# Patient Record
Sex: Male | Born: 1980 | Race: White | Hispanic: No | Smoking: Current every day smoker
Health system: Southern US, Community
[De-identification: ages and names within clinical notes are randomized; demographics above are authoritative.]

## PROBLEM LIST (undated history)

## (undated) HISTORY — PX: FEMUR FRACTURE SURGERY: SHX633

---

## 2016-02-08 ENCOUNTER — Emergency Department (HOSPITAL_BASED_OUTPATIENT_CLINIC_OR_DEPARTMENT_OTHER): Payer: Self-pay

## 2016-02-08 ENCOUNTER — Encounter (HOSPITAL_BASED_OUTPATIENT_CLINIC_OR_DEPARTMENT_OTHER): Payer: Self-pay | Admitting: *Deleted

## 2016-02-08 ENCOUNTER — Emergency Department (HOSPITAL_BASED_OUTPATIENT_CLINIC_OR_DEPARTMENT_OTHER)
Admission: EM | Admit: 2016-02-08 | Discharge: 2016-02-08 | Disposition: A | Discharge status: Home / Self Care | Payer: Self-pay | Attending: Emergency Medicine | Admitting: Emergency Medicine

## 2016-02-08 DIAGNOSIS — N5082 Scrotal pain: Secondary | ICD-10-CM

## 2016-02-08 DIAGNOSIS — N50811 Right testicular pain: Secondary | ICD-10-CM | POA: Insufficient documentation

## 2016-02-08 DIAGNOSIS — N50819 Testicular pain, unspecified: Secondary | ICD-10-CM

## 2016-02-08 DIAGNOSIS — G8929 Other chronic pain: Secondary | ICD-10-CM | POA: Insufficient documentation

## 2016-02-08 DIAGNOSIS — R52 Pain, unspecified: Secondary | ICD-10-CM

## 2016-02-08 DIAGNOSIS — N50812 Left testicular pain: Secondary | ICD-10-CM | POA: Insufficient documentation

## 2016-02-08 DIAGNOSIS — M79651 Pain in right thigh: Secondary | ICD-10-CM | POA: Insufficient documentation

## 2016-02-08 DIAGNOSIS — N419 Inflammatory disease of prostate, unspecified: Secondary | ICD-10-CM | POA: Insufficient documentation

## 2016-02-08 DIAGNOSIS — M25551 Pain in right hip: Secondary | ICD-10-CM | POA: Insufficient documentation

## 2016-02-08 LAB — URINE MICROSCOPIC-ADD ON

## 2016-02-08 LAB — URINALYSIS, ROUTINE W REFLEX MICROSCOPIC
GLUCOSE, UA: NEGATIVE mg/dL
HGB URINE DIPSTICK: NEGATIVE
Ketones, ur: 40 mg/dL — AB
Nitrite: NEGATIVE
Protein, ur: 30 mg/dL — AB
SPECIFIC GRAVITY, URINE: 1.023 (ref 1.005–1.030)
pH: 5.5 (ref 5.0–8.0)

## 2016-02-08 LAB — OCCULT BLOOD X 1 CARD TO LAB, STOOL: FECAL OCCULT BLD: NEGATIVE

## 2016-02-08 MED ORDER — AZITHROMYCIN 1 G PO PACK
1.0000 g | PACK | Freq: Once | ORAL | Status: AC
  Administered 2016-02-08: 1 g via ORAL
  Filled 2016-02-08: qty 1

## 2016-02-08 MED ORDER — IBUPROFEN 800 MG PO TABS: 800.0000 mg | ORAL_TABLET | Freq: Three times a day (TID) | ORAL | Status: AC

## 2016-02-08 MED ORDER — CIPROFLOXACIN HCL 500 MG PO TABS
500.0000 mg | ORAL_TABLET | Freq: Two times a day (BID) | ORAL | Status: AC
Start: 2016-02-08 — End: ?

## 2016-02-08 MED ORDER — IBUPROFEN 800 MG PO TABS
800.0000 mg | ORAL_TABLET | Freq: Once | ORAL | Status: AC
  Administered 2016-02-08: 800 mg via ORAL
  Filled 2016-02-08: qty 1

## 2016-02-08 MED FILL — CIPROFLOXACIN HCL 500 MG TA: 500 | 9 days supply | Qty: 18 | Fill #0

## 2016-02-08 NOTE — ED Provider Notes (Signed)
CSN: [id number]     Arrival date & time 02/08/16  1249 History   First MD Initiated Contact with Patient 02/08/16 1504     Chief Complaint  Patient presents with  . Leg Pain     (Consider location/radiation/quality/duration/timing/severity/associated sxs/prior Treatment) HPI The patient reports problems with scrotal pain, ongoing for approximately a year.Marland Kitchen He reports his testicles hurt. The pain waxes and waning in severity. Sometimes is both testicles. Sometimes the left more than the right. He gets an aching pain like he's been "kicked". He denies urinary difficulty. No pain burning or urgency. No testicular lesions or drainage. His girlfriend also notes little nodules in the scrotum. She also reports that several months ago he was having bleeding with bowel movements. That reportedly lasted for several weeks and then resolved. Patient denies any weight loss. She reports she's always been thin. He has not had fevers or chills. He does not get nausea or vomiting. He also reports chronic pain in his right hip and thigh. He had distant rod placement for traumatic injury. That was about 10 years ago. He reports he feels like it is not in the right place and is concerned that it may have "moved". He is however ambulatory weightbearing on it. There has been no recent injury. His girlfriend is requesting an x-ray to check on it. They have not sought care because he reports he has no insurance and has not been able seek medical care elsewhere. Patient is a chronic smoker. He reports baseline chronic cough without chest pain or shortness of breath. History reviewed. No pertinent past medical history. History reviewed. No pertinent past surgical history. No family history on file. Social History  Substance Use Topics  . Smoking status: Current Every Day Smoker -- 0.50 packs/day    Types: Cigarettes  . Smokeless tobacco: None  . Alcohol Use: No    Review of Systems 10 Systems reviewed and are  negative for acute change except as noted in the HPI.    Allergies  Review of patient's allergies indicates no known allergies.  Home Medications   Prior to Admission medications   Medication Sig Start Date End Date Taking? Authorizing Provider  ciprofloxacin (CIPRO) 500 MG tablet Take 1 tablet (500 mg total) by mouth 2 (two) times daily. One po bid x 7 days 02/08/16   Arby Barrette, MD  ibuprofen (ADVIL,MOTRIN) 800 MG tablet Take 1 tablet (800 mg total) by mouth 3 (three) times daily. 02/08/16   Arby Barrette, MD   BP 122/61 mmHg  Pulse 70  Temp(Src) 98.6 F (37 C) (Oral)  Resp 18  Ht  (1.626 m)  Wt 125 lb (56.7 kg)  BMI 21.45 kg/m2  SpO2 98% Physical Exam  Constitutional: He is oriented to person, place, and time. He appears well-developed and well-nourished.  HENT:  Head: Normocephalic and atraumatic.  Eyes: EOM are normal. Pupils are equal, round, and reactive to light.  Neck: Neck supple.  Cardiovascular: Normal rate, regular rhythm, normal heart sounds and intact distal pulses.   Pulmonary/Chest: Effort normal and breath sounds normal. No respiratory distress. He has no wheezes. He has no rales.  Abdominal: Soft. Bowel sounds are normal. He exhibits no distension. There is tenderness.  Patient endorses mild suprapubic discomfort. No palpable mass.  Genitourinary: Penis normal.  Penis is normal without discharge or lesions. Scrotum is normal without edema or erythema. Palpation does not reveal abnormal feeling nodules. Spermatic cord is noninflamed. There are small irregularities that they may have  noticed appears most consistent with venous varicosities. Testicles are smooth without appreciable mass. External inspection of the anus is normal without hemorrhoids or fissures. Digital examination does not reveal internal mass. Prostate is not enlarged and no appreciable nodules.  Musculoskeletal: Normal range of motion. He exhibits no edema.  Patient indicates pain on his  right trochanter region. Range of motion is intact with flexion and extension at the hip. There is no erythema or signs of soft tissue infection or anomaly. No edema of the leg.  Neurological: He is alert and oriented to person, place, and time. He has normal strength. Coordination normal. GCS eye subscore is 4. GCS verbal subscore is 5. GCS motor subscore is 6.  Skin: Skin is warm, dry and intact.  Psychiatric: He has a normal mood and affect.    ED Course  Procedures (including critical care time) Labs Review Labs Reviewed  URINALYSIS, ROUTINE W REFLEX MICROSCOPIC (NOT AT [facility]) - Abnormal; Notable for the following:    Color, Urine AMBER (*)    Bilirubin Urine SMALL (*)    Ketones, ur 40 (*)    Protein, ur 30 (*)    Leukocytes, UA SMALL (*)    All other components within normal limits  URINE MICROSCOPIC-ADD ON - Abnormal; Notable for the following:    Squamous Epithelial / LPF 0-5 (*)    Bacteria, UA FEW (*)    All other components within normal limits  URINE CULTURE  OCCULT BLOOD X 1 CARD TO LAB, STOOL  GC/CHLAMYDIA PROBE AMP ([place]) NOT AT [facility]    Imaging Review US Scrotum  02/08/2016  CLINICAL DATA:  Scrotal pain EXAM: SCROTAL ULTRASOUND DOPPLER ULTRASOUND OF THE TESTICLES TECHNIQUE: Complete ultrasound examination of the testicles, epididymis, and other scrotal structures was performed. Color and spectral Doppler ultrasound were also utilized to evaluate blood flow to the testicles. COMPARISON:  None. FINDINGS: Right testicle Measurements: 4.5 x 2.3 x 2.5 cm. No mass or microlithiasis visualized. Left testicle Measurements: 4.2 x 2.0 x 2.4 cm. No mass or microlithiasis visualized. Right epididymis:  Normal in size and appearance. Left epididymis:  Normal in size and appearance. Hydrocele:  There are small hydroceles bilaterally. Varicocele:  None visualized. Pulsed Doppler interrogation of both testes demonstrates normal low resistance arterial and venous waveforms  bilaterally. The peak systolic velocity in the right testis is 5 cm/sec. The peak systolic velocity in the left testis is 6 cm/sec. There is no scrotal wall thickening or scrotal abscess on either side. IMPRESSION: Small hydroceles bilaterally. No inflammatory focus in either testis or epididymal regions. No intratesticular or extratesticular mass. No testicular torsion. Electronically Signed   By: Bretta Bang III M.D.   On: 02/08/2016 16:02   [place] Art/ven Flow Abd Pelv Doppler  02/08/2016  CLINICAL DATA:  Scrotal pain EXAM: SCROTAL ULTRASOUND DOPPLER ULTRASOUND OF THE TESTICLES TECHNIQUE: Complete ultrasound examination of the testicles, epididymis, and other scrotal structures was performed. Color and spectral Doppler ultrasound were also utilized to evaluate blood flow to the testicles. COMPARISON:  None. FINDINGS: Right testicle Measurements: 4.5 x 2.3 x 2.5 cm. No mass or microlithiasis visualized. Left testicle Measurements: 4.2 x 2.0 x 2.4 cm. No mass or microlithiasis visualized. Right epididymis:  Normal in size and appearance. Left epididymis:  Normal in size and appearance. Hydrocele:  There are small hydroceles bilaterally. Varicocele:  None visualized. Pulsed Doppler interrogation of both testes demonstrates normal low resistance arterial and venous waveforms bilaterally. The peak systolic velocity in the right testis  is 5 cm/sec. The peak systolic velocity in the left testis is 6 cm/sec. There is no scrotal wall thickening or scrotal abscess on either side. IMPRESSION: Small hydroceles bilaterally. No inflammatory focus in either testis or epididymal regions. No intratesticular or extratesticular mass. No testicular torsion. Electronically Signed   By: Bretta BangWilliam  Woodruff III M.D.   On: 02/08/2016 16:02   Dg Femur, Min 2 Views Right  02/08/2016  CLINICAL DATA:  Chronic pain. Fracture with intramedullary nailing 8 years prior EXAM: RIGHT FEMUR 2 VIEWS COMPARISON:  None. FINDINGS: Frontal and  lateral views were obtained. There is rod fixation through a prior fracture of the mid femur. There is bony remodeling in the area of prior fracture. On the frontal view, the periosteum in this area is benign-appearing. On the lateral view, the periosteum in this area is minimally irregular. No acute fracture or dislocation. Joint spaces appear normal. No knee joint effusion. IMPRESSION: Bony remodeling at the site of prior fracture mid femur. No acute fracture or dislocation. No erosive change or bony destruction evident. Note that on the lateral view, the periosteum in this area of prior fracture is minimally irregular. This irregularity is of uncertain significance. It should be noted that indolent osteomyelitis at a fracture site can sometimes be difficult to discern by radiography given the remodeling changes. It may be prudent to consider nuclear medicine bone scan with particular attention to the area of prior fracture to further evaluate. Increased metabolic activity 8 years after repair would be considered abnormal and raise concern for ongoing metabolically active pathology in this area. Electronically Signed   By: Bretta BangWilliam  Woodruff III M.D.   On: 02/08/2016 15:43   I have personally reviewed and evaluated these images and lab results as part of my medical decision-making.   EKG Interpretation None      MDM   Final diagnoses:  Testicular pain  Thigh pain, musculoskeletal, right  Prostatitis, unspecified prostatitis type   Patient complains of testicular pain and low abdominal and back pain present for prolonged period of time. Ultrasound does not show testicular anomaly. Rectal digital examination the patient did endorse tenderness the prostate although it was not boggy or enlarged to palpation. Based on his symptoms, I will treat him empirically for prostatitis. He is nontoxic and otherwise well appearance. Patient is counseled to follow up with urology. He is advised first to get  established with primary care and is given the resource of [facility] and wellness. He has secondary complaint of chronic pain in his thigh from a old surgical rod insertion from up to 10 years ago. There are no acute findings. He is counseled to follow-up with orthopedics for further evaluation. Consultation has been placed to care management to assist the patient in finding community resources for uninsured medical care.    Arby BarretteMarcy Makell Cyr, MD 02/08/16 [phone]

## 2016-02-08 NOTE — ED Notes (Signed)
Scrotal pain and pain in his legs for a year. Girl friend states it has taken her this long to get him to come for a check up.

## 2016-02-08 NOTE — ED Notes (Signed)
MD at bedside. 

## 2016-02-08 NOTE — ED Notes (Signed)
D/c home with family. Directed to pharmacy to pick up medications

## 2016-02-08 NOTE — Discharge Instructions (Signed)
Possible Prostatitis The prostate gland is about the size and shape of a walnut. It is located just below your bladder. It produces one of the components of semen, which is made up of sperm and the fluids that help nourish and transport it out from the testicles. Prostatitis is inflammation of the prostate gland.  There are four types of prostatitis:  Acute bacterial prostatitis. This is the least common type of prostatitis. It starts quickly and usually is associated with a bladder infection, high fever, and shaking chills. It can occur at any age.  Chronic bacterial prostatitis. This is a persistent bacterial infection in the prostate. It usually develops from repeated acute bacterial prostatitis or acute bacterial prostatitis that was not properly treated. It can occur in men of any age but is most common in middle-aged men whose prostate has begun to enlarge. The symptoms are not as severe as those in acute bacterial prostatitis. Discomfort in the part of your body that is in front of your rectum and below your scrotum (perineum), lower abdomen, or in the head of your penis (glans) may represent your primary discomfort.  Chronic prostatitis (nonbacterial). This is the most common type of prostatitis. It is inflammation of the prostate gland that is not caused by a bacterial infection. The cause is unknown and may be associated with a viral infection or autoimmune disorder.  Prostatodynia (pelvic floor disorder). This is associated with increased muscular tone in the pelvis surrounding the prostate. CAUSES The causes of bacterial prostatitis are bacterial infection. The causes of the other types of prostatitis are unknown.  SYMPTOMS  Symptoms can vary depending upon the type of prostatitis that exists. There can also be overlap in symptoms. Possible symptoms for each type of prostatitis are listed below. Acute Bacterial Prostatitis  Painful urination.  Fever or chills.  Muscle or joint  pains.  Low back pain.  Low abdominal pain.  Inability to empty bladder completely. Chronic Bacterial Prostatitis, Chronic Nonbacterial Prostatitis, and Prostatodynia  Sudden urge to urinate.  Frequent urination.  Difficulty starting urine stream.  Weak urine stream.  Discharge from the urethra.  Dribbling after urination.  Rectal pain.  Pain in the testicles, penis, or tip of the penis.  Pain in the perineum.  Problems with sexual function.  Painful ejaculation.  Bloody semen. DIAGNOSIS  In order to diagnose prostatitis, your health care provider will ask about your symptoms. One or more urine samples will be taken and tested (urinalysis). If the urinalysis result is negative for bacteria, your health care provider may use a finger to feel your prostate (digital rectal exam). This exam helps your health care provider determine if your prostate is swollen and tender. It will also produce a specimen of semen that can be analyzed. TREATMENT  Treatment for prostatitis depends on the cause. If a bacterial infection is the cause, it can be treated with antibiotic medicine. In cases of chronic bacterial prostatitis, the use of antibiotics for up to 1 month or 6 weeks may be necessary. Your health care provider may instruct you to take sitz baths to help relieve pain. A sitz bath is a bath of hot water in which your hips and buttocks are under water. This relaxes the pelvic floor muscles and often helps to relieve the pressure on your prostate. HOME CARE INSTRUCTIONS   Take all medicines as directed by your health care provider.  Take sitz baths as directed by your health care provider. SEEK MEDICAL CARE IF:   Your  symptoms get worse, not better.  You have a fever. SEEK IMMEDIATE MEDICAL CARE IF:   You have chills.  You feel nauseous or vomit.  You feel lightheaded or faint.  You are unable to urinate.  You have blood or blood clots in your urine. MAKE SURE  YOU:  Understand these instructions.  Will watch your condition.  Will get help right away if you are not doing well or get worse.   This information is not intended to replace advice given to you by your health care provider. Make sure you discuss any questions you have with your health care provider.   Document Released: 11/01/2000 Document Revised: 11/25/2014 Document Reviewed: 05/24/2013 Elsevier Interactive Patient Education 2016 [employer].  You have also reported pain from a previous surgical site in your thigh. Your x-ray does not show any apparent movement or fracture around your intramedullary rod. You must follow up with an orthopedic doctor for reevaluation of this old injury. Dr. Lajoyce Corners is listed in your follow-up instructions. He is an orthopedic doctor who can reevaluate your injury and assess any changes.

## 2016-02-09 LAB — GC/CHLAMYDIA PROBE AMP (~~LOC~~) NOT AT ARMC
Chlamydia: NEGATIVE
Neisseria Gonorrhea: NEGATIVE

## 2016-02-09 LAB — URINE CULTURE: CULTURE: NO GROWTH

## 2016-02-20 ENCOUNTER — Inpatient Hospital Stay: Payer: Self-pay

## 2016-04-01 ENCOUNTER — Ambulatory Visit: Payer: Self-pay | Admitting: Internal Medicine

## 2016-10-05 IMAGING — CR DG FEMUR 2+V*R*
4 series · 4 of 4 positions shown · non-contrast
Comparison: None.

CLINICAL DATA: Chronic pain. Fracture with intramedullary nailing 8
years prior

EXAM:
RIGHT FEMUR 2 VIEWS

[t femur with hip  ap right]
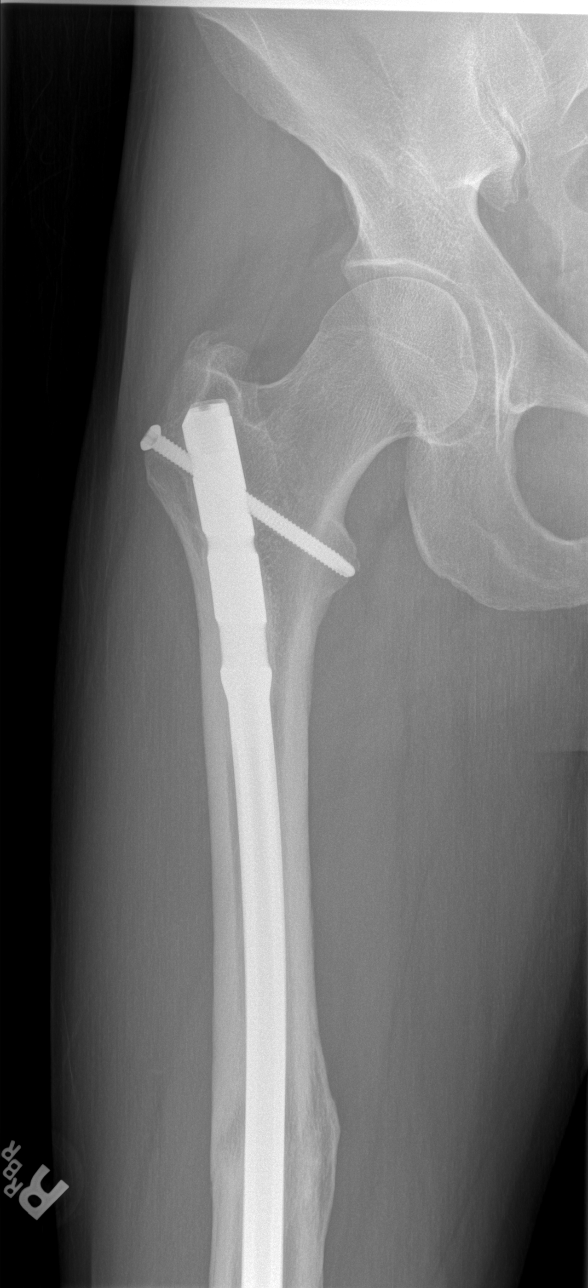

[t femur with knee ap right]
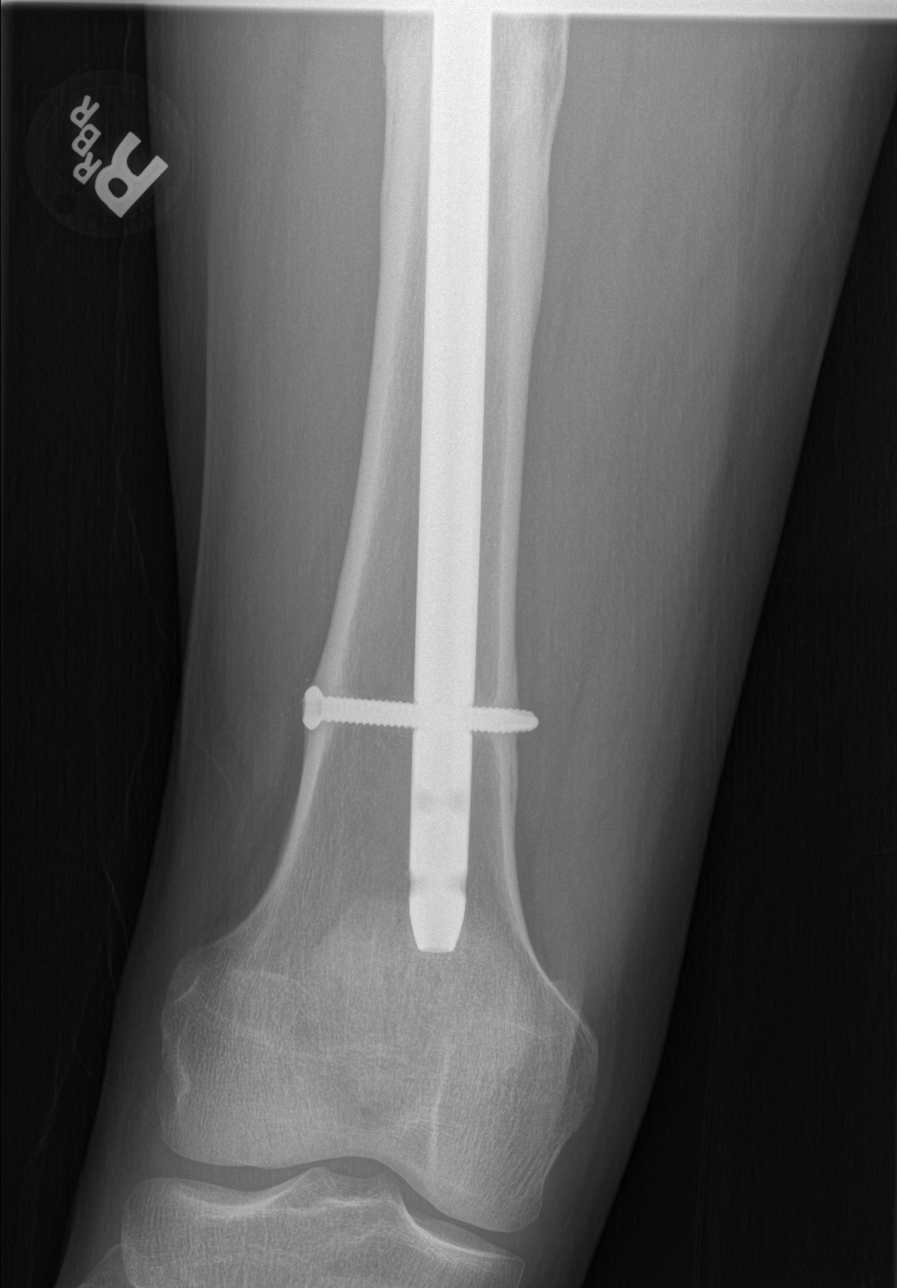

[t femur with hip lat right]
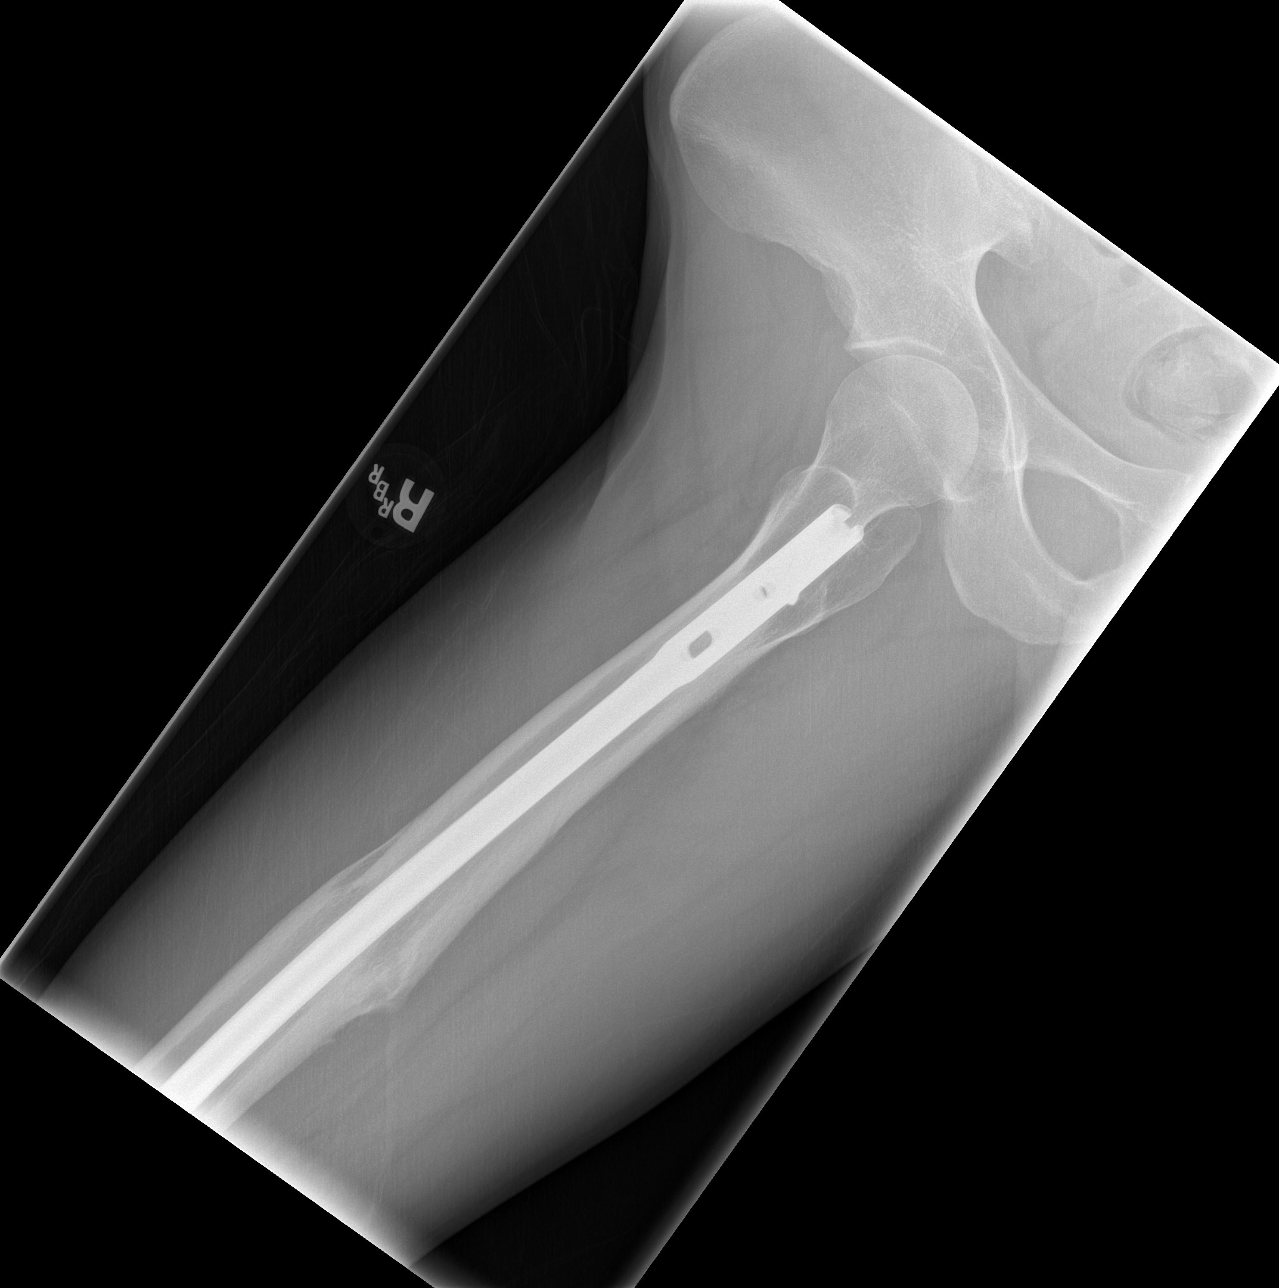

[t femur with knee lat right]
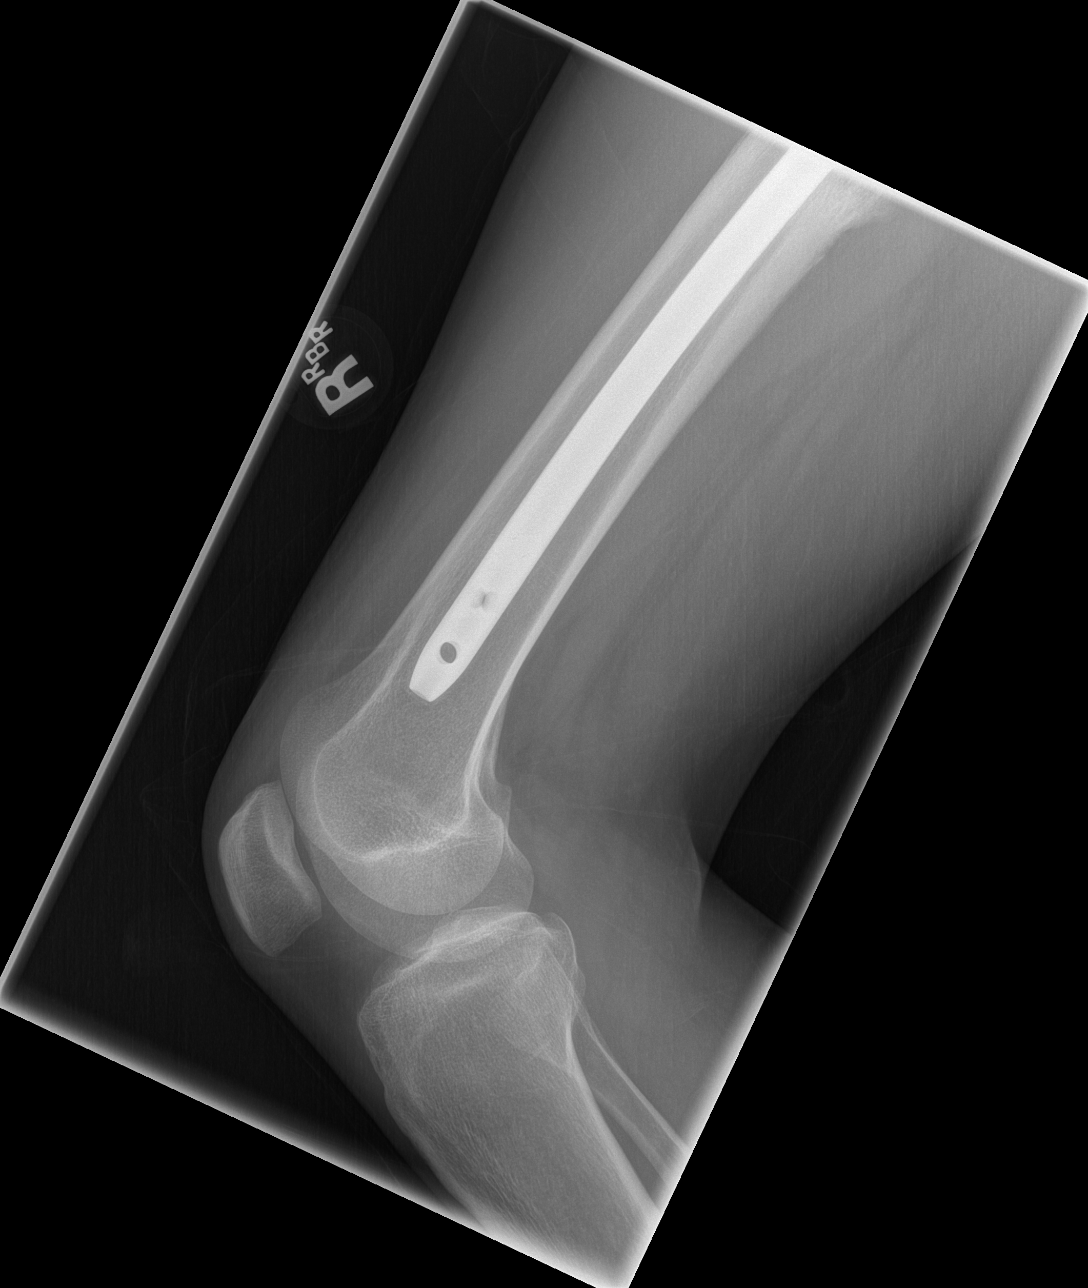

[4 of 4 positions shown; findings below may reference images not displayed]

FINDINGS: Frontal and lateral views were obtained. There is rod fixation
through a prior fracture of the mid femur. There is bony remodeling
in the area of prior fracture. On the frontal view, the periosteum
in this area is benign-appearing. On the lateral view, the
periosteum in this area is minimally irregular. No acute fracture or
dislocation. Joint spaces appear normal. No knee joint effusion.
IMPRESSION: Bony remodeling at the site of prior fracture mid femur. No acute
fracture or dislocation. No erosive change or bony destruction
evident. Note that on the lateral view, the periosteum in this area
of prior fracture is minimally irregular. This irregularity is of
uncertain significance.

It should be noted that indolent osteomyelitis at a fracture site
can sometimes be difficult to discern by radiography given the
remodeling changes. It may be prudent to consider nuclear medicine
bone scan with particular attention to the area of prior fracture to
further evaluate. Increased metabolic activity 8 years after repair
would be considered abnormal and raise concern for ongoing
metabolically active pathology in this area.

## 2017-04-22 ENCOUNTER — Encounter (HOSPITAL_BASED_OUTPATIENT_CLINIC_OR_DEPARTMENT_OTHER): Payer: Self-pay | Admitting: *Deleted

## 2017-04-22 ENCOUNTER — Emergency Department (HOSPITAL_BASED_OUTPATIENT_CLINIC_OR_DEPARTMENT_OTHER)
Admission: EM | Admit: 2017-04-22 | Discharge: 2017-04-22 | Disposition: A | Discharge status: Home / Self Care | Payer: Self-pay | Attending: Emergency Medicine | Admitting: Emergency Medicine

## 2017-04-22 DIAGNOSIS — F17210 Nicotine dependence, cigarettes, uncomplicated: Secondary | ICD-10-CM | POA: Insufficient documentation

## 2017-04-22 DIAGNOSIS — Z791 Long term (current) use of non-steroidal anti-inflammatories (NSAID): Secondary | ICD-10-CM | POA: Insufficient documentation

## 2017-04-22 DIAGNOSIS — K0889 Other specified disorders of teeth and supporting structures: Secondary | ICD-10-CM | POA: Insufficient documentation

## 2017-04-22 MED ORDER — BUPIVACAINE-EPINEPHRINE (PF) 0.5% -1:200000 IJ SOLN
1.8000 mL | Freq: Once | INTRAMUSCULAR | Status: AC
  Administered 2017-04-22: 1.8 mL
  Filled 2017-04-22: qty 1.8

## 2017-04-22 MED ORDER — AMOXICILLIN-POT CLAVULANATE 875-125 MG PO TABS: 1.0000 | ORAL_TABLET | Freq: Two times a day (BID) | ORAL | 0 refills | Status: AC

## 2017-04-22 MED ORDER — LIDOCAINE-EPINEPHRINE 2 %-1:100000 IJ SOLN: 1.7000 mL | Freq: Once | INTRAMUSCULAR | Status: DC

## 2017-04-22 MED ORDER — LIDOCAINE-EPINEPHRINE (PF) 2 %-1:200000 IJ SOLN
INTRAMUSCULAR | Status: AC
  Administered 2017-04-22: 10:00:00
  Filled 2017-04-22: qty 10

## 2017-04-22 MED FILL — AMOX-CLAV 875-125 MG TABLET: 875-125 | 7 days supply | Qty: 14 | Fill #0

## 2017-04-22 NOTE — ED Notes (Signed)
Pt d/c home. Directed to pharmacy to pick up Rx. Resource guide reviewed

## 2017-04-22 NOTE — ED Notes (Signed)
Pt requesting resources for low income dental care

## 2017-04-22 NOTE — ED Provider Notes (Addendum)
Medical screening examination/treatment/procedure(s) were conducted as a shared visit with non-physician practitioner(s) and myself.  I personally evaluated the patient during the encounter. Briefly, the patient is a 36 y.o. male who presents with tooth pain following tooth fracture 6 months ago. Has associated swelling. No drainable abscess palpated. No evidence to suggest deep tissue infection that would be concerning for Ludwig's. Nerve block for pain controlled performed by midlevel. Will treat with Abx and provide patient with resources for local dentist. The patient is safe for discharge with strict return precautions.      Nira Connardama, Quinn Bartling Eduardo, MD 04/22/17 [phone]

## 2017-04-22 NOTE — ED Notes (Signed)
ED Provider at bedside. Dr. Cardama 

## 2017-04-22 NOTE — Discharge Instructions (Signed)
Please take Ibuprofen (Advil, motrin) and Tylenol (acetaminophen) to relieve your pain.  You may take up to 800 MG (4 pills) of normal strength ibuprofen every 8 hours as needed.  In between doses of ibuprofen you make take tylenol, up to 1,000 mg (two extra strength pills).  Do not take more than 3,000 mg tylenol in a 24 hour period.  Please check all medication labels as many medications such as pain and cold medications may contain tylenol.  Do not drink alcohol while taking these medications.  Do not take other NSAID'S while taking ibuprofen (such as aleve or naproxen).  Please stop using tobacco products.

## 2017-04-22 NOTE — ED Provider Notes (Signed)
[facility] Provider Note   CSN: [id number] Arrival date & time: 04/22/17  [id number]     History   Chief Complaint Chief Complaint  Patient presents with  . Dental Pain    HPI Jacob Valenzuela is a 36 y.o. male who presents with dental pain.  He broke tooth #18 about 6 months ago and it has been causing him pain since then.  He reports that two weeks ago he noticed increased pain and left lower jaw swelling which lasted for two days and resolved.  Three days ago he began noticing increased pain and swelling in the same location.  He does not have a dentist, does not have insurance.  He uses chewing tobacco daily HPI  History reviewed. No pertinent past medical history.  There are no active problems to display for this patient.   Past Surgical History:  Procedure Laterality Date  . FEMUR FRACTURE SURGERY         Home Medications    Prior to Admission medications   Medication Sig Start Date End Date Taking? Authorizing Provider  amoxicillin-clavulanate (AUGMENTIN) 875-125 MG tablet Take 1 tablet by mouth every 12 (twelve) hours. 04/22/17   Cristina GongHammond, Zain Lankford W, PA-C  ciprofloxacin (CIPRO) 500 MG tablet Take 1 tablet (500 mg total) by mouth 2 (two) times daily. One po bid x 7 days 02/08/16   Arby BarrettePfeiffer, Marcy, MD  ibuprofen (ADVIL,MOTRIN) 800 MG tablet Take 1 tablet (800 mg total) by mouth 3 (three) times daily. 02/08/16   Arby BarrettePfeiffer, Marcy, MD    Family History No family history on file.  Social History Social History  Substance Use Topics  . Smoking status: Current Every Day Smoker    Packs/day: 0.50    Types: Cigarettes  . Smokeless tobacco: Current User    Types: Snuff  . Alcohol use Yes     Comment: weekends      Allergies   Patient has no known allergies.   Review of Systems Review of Systems  Constitutional: Negative for chills, fatigue and fever.  HENT: Positive for dental problem, ear pain and facial swelling. Negative for drooling, mouth sores  and trouble swallowing.      Physical Exam Updated Vital Signs BP 121/79 (BP Location: Left Arm)   Pulse (!) 109   Temp 98.3 F (36.8 C) (Oral)   Resp 16   Ht 5\' 4"  (1.626 m)   Wt 53.5 kg (118 lb)   SpO2 97%   BMI 20.25 kg/m   Physical Exam  Constitutional: He appears well-developed and well-nourished. He does not have a sickly appearance.  HENT:  Head: Normocephalic and atraumatic. Head is without right periorbital erythema and without left periorbital erythema.  Right Ear: Tympanic membrane, external ear and ear canal normal. No mastoid tenderness.  Left Ear: Tympanic membrane, external ear and ear canal normal. No mastoid tenderness.  Nose: Nose normal.  Mouth/Throat: Uvula is midline, oropharynx is clear and moist and mucous membranes are normal. No oral lesions. No trismus in the jaw. Abnormal dentition. Dental caries (Multiple dental caries, broken teeth. ) present. No dental abscesses, uvula swelling or lacerations. No oropharyngeal exudate, posterior oropharyngeal edema or posterior oropharyngeal erythema. No tonsillar exudate.  Eyes: Right eye exhibits no discharge. Left eye exhibits no discharge. No scleral icterus.  Neck: Trachea normal, normal range of motion and full passive range of motion without pain. Neck supple. No neck rigidity. No tracheal deviation, no edema, no erythema and normal range of motion present.  Skin: Skin  is warm and dry. He is not diaphoretic.  Nursing note and vitals reviewed.    ED Treatments / Results  Labs (all labs ordered are listed, but only abnormal results are displayed) Labs Reviewed - No data to display  EKG  EKG Interpretation None       Radiology No results found.  Procedures Dental Block Date/Time: 04/22/2017 9:53 AM Performed by: Cristina Gong Authorized by: Cristina Gong   Consent:    Consent obtained:  Verbal   Consent given by:  Patient   Risks discussed:  Allergic reaction, intravascular  injection, infection, nerve damage, swelling, unsuccessful block, pain and hematoma   Alternatives discussed:  No treatment and referral Indications:    Indications: dental pain   Location:    Anesthesia block type: Inferior alveolar, and supraperiostall. Procedure details (see MAR for exact dosages):    Syringe type:  Luer lock syringe   Needle gauge:  27 G   Anesthetic injected:  Bupivacaine 0.5% WITH epi and lidocaine 2% WITH epi   Injection procedure:  Anatomic landmarks identified, introduced needle, incremental injection and negative aspiration for blood Post-procedure details:    Outcome:  Pain improved   Patient tolerance of procedure: Inferior alveolar nerve block tolerated well with out immediate complications, Supraperiosteal inflitration was terminated for patient and provider safety. Comments:     Inferior alveolar nerve block completed with moderate pain relief.  Patient requested additional numbing medication.  Supraperiosteal infiltration was attempted, while injecting medication patient hit my hand holding the syringe with his hand while needle in his mouth.  The procedure was terminated for safety of patient and provider.    (including critical care time)  Medications Ordered in ED Medications  lidocaine-EPINEPHrine (XYLOCAINE W/EPI) 2 %-1:100000 (with pres) injection 1.7 mL (not administered)  lidocaine-EPINEPHrine (XYLOCAINE W/EPI) 2 %-1:200000 (PF) injection (not administered)  bupivacaine-epinephrine (MARCAINE W/ EPI) 0.5% -1:200000 injection 1.8 mL (1.8 mLs Infiltration Given by Other 04/22/17 4098)     Initial Impression / Assessment and Plan / ED Course  I have reviewed the triage vital signs and the nursing notes.  Pertinent labs & imaging results that were available during my care of the patient were reviewed by me and considered in my medical decision making (see chart for details).    Patient with toothache.  No gross abscess.  Exam unconcerning for  Ludwig's angina or spread of infection.  Will treat with augmentin and OTC pain medicine.  Performed dental block which resulted in moderate relief of pain.   Urged patient to follow-up with dentist and to stop using tobacco products.     Final Clinical Impressions(s) / ED Diagnoses   Final diagnoses:  Pain, dental    New Prescriptions New Prescriptions   AMOXICILLIN-CLAVULANATE (AUGMENTIN) 875-125 MG TABLET    Take 1 tablet by mouth every 12 (twelve) hours.     Cristina Gong, [place] 04/22/17 1002

## 2017-04-22 NOTE — ED Triage Notes (Signed)
Pt reports tooth broke 6 months ago (left lower molar). States 2 weeks ago his jaw was swollen but resolved. 2 days ago swelling returned and he states he has pain in his throat and left ear also

## 2018-10-08 ENCOUNTER — Other Ambulatory Visit: Payer: Self-pay

## 2018-10-08 ENCOUNTER — Encounter (HOSPITAL_BASED_OUTPATIENT_CLINIC_OR_DEPARTMENT_OTHER): Payer: Self-pay

## 2018-10-08 ENCOUNTER — Emergency Department (HOSPITAL_BASED_OUTPATIENT_CLINIC_OR_DEPARTMENT_OTHER)
Admission: EM | Admit: 2018-10-08 | Discharge: 2018-10-08 | Disposition: A | Discharge status: Home / Self Care | Payer: Self-pay | Attending: Emergency Medicine | Admitting: Emergency Medicine

## 2018-10-08 DIAGNOSIS — Z5321 Procedure and treatment not carried out due to patient leaving prior to being seen by health care provider: Secondary | ICD-10-CM | POA: Insufficient documentation

## 2018-10-08 DIAGNOSIS — K0889 Other specified disorders of teeth and supporting structures: Secondary | ICD-10-CM | POA: Insufficient documentation

## 2018-10-08 NOTE — ED Triage Notes (Signed)
Pt c/o left lower and left upper toothache x "years"-requesting abx-NAD-steady gait

## 2019-10-13 ENCOUNTER — Other Ambulatory Visit: Payer: Self-pay

## 2019-10-13 ENCOUNTER — Emergency Department (HOSPITAL_BASED_OUTPATIENT_CLINIC_OR_DEPARTMENT_OTHER)
Admission: EM | Admit: 2019-10-13 | Discharge: 2019-10-13 | Disposition: A | Discharge status: Home / Self Care | Payer: Self-pay | Attending: Emergency Medicine | Admitting: Emergency Medicine

## 2019-10-13 ENCOUNTER — Encounter (HOSPITAL_BASED_OUTPATIENT_CLINIC_OR_DEPARTMENT_OTHER): Payer: Self-pay | Admitting: Emergency Medicine

## 2019-10-13 DIAGNOSIS — F17210 Nicotine dependence, cigarettes, uncomplicated: Secondary | ICD-10-CM | POA: Insufficient documentation

## 2019-10-13 DIAGNOSIS — W57XXXA Bitten or stung by nonvenomous insect and other nonvenomous arthropods, initial encounter: Secondary | ICD-10-CM | POA: Insufficient documentation

## 2019-10-13 DIAGNOSIS — Y999 Unspecified external cause status: Secondary | ICD-10-CM | POA: Insufficient documentation

## 2019-10-13 DIAGNOSIS — Y929 Unspecified place or not applicable: Secondary | ICD-10-CM | POA: Insufficient documentation

## 2019-10-13 DIAGNOSIS — L739 Follicular disorder, unspecified: Secondary | ICD-10-CM | POA: Insufficient documentation

## 2019-10-13 DIAGNOSIS — Y939 Activity, unspecified: Secondary | ICD-10-CM | POA: Insufficient documentation

## 2019-10-13 MED ORDER — SULFAMETHOXAZOLE-TRIMETHOPRIM 800-160 MG PO TABS: 1.0000 | ORAL_TABLET | Freq: Two times a day (BID) | ORAL | 0 refills | Status: AC

## 2019-10-13 MED ORDER — PENTAFLUOROPROP-TETRAFLUOROETH EX AERO
INHALATION_SPRAY | CUTANEOUS | Status: AC
  Filled 2019-10-13: qty 30

## 2019-10-13 NOTE — Discharge Instructions (Addendum)
You were evaluated in the Emergency Department and after careful evaluation, we did not find any emergent condition requiring admission or further testing in the hospital.  Your exam/testing today is overall reassuring.  We drained your abscess here in the emergency department.  Please take the antibiotics provided as directed.  Please return to the Emergency Department if you experience any worsening of your condition.  We encourage you to follow up with a primary care provider.  Thank you for allowing [place] to be a part of your care.

## 2019-10-13 NOTE — ED Triage Notes (Signed)
Thinks a spider bit his right arm near antecubital area. Red with dark center. No fever

## 2019-10-13 NOTE — ED Provider Notes (Signed)
[facility] Emergency Department Provider Note MRN:  [id number]  Arrival date & time: 10/13/19     Chief Complaint   Insect Bite   History of Present Illness   Jacob Valenzuela is a 38 y.o. year-old male with no pertinent past medical history presenting to the ED with chief complaint of insect bite.  Patient has a boil to his right arm, present for 1 day, causing him moderate to severe pain, worse with motion or palpation.  Denies fever, denies IV drug use, no other complaints.  Review of Systems  A problem-focused ROS was performed. Positive for boil.  Patient denies fever.  Patient's Health History   History reviewed. No pertinent past medical history.  Past Surgical History:  Procedure Laterality Date   FEMUR FRACTURE SURGERY      No family history on file.  Social History   Socioeconomic History   Marital status: Significant Other    Spouse name: Not on file   Number of children: Not on file   Years of education: Not on file   Highest education level: Not on file  Occupational History   Not on file  Social Needs   Financial resource strain: Not on file   Food insecurity    Worry: Not on file    Inability: Not on file   Transportation needs    Medical: Not on file    Non-medical: Not on file  Tobacco Use   Smoking status: Current Every Day Smoker    Packs/day: 0.50    Types: Cigarettes   Smokeless tobacco: Current User    Types: Snuff  Substance and Sexual Activity   Alcohol use: Yes    Comment: occ   Drug use: No   Sexual activity: Not on file  Lifestyle   Physical activity    Days per week: Not on file    Minutes per session: Not on file   Stress: Not on file  Relationships   Social connections    Talks on phone: Not on file    Gets together: Not on file    Attends religious service: Not on file    Active member of club or organization: Not on file    Attends meetings of clubs or organizations: Not  on file    Relationship status: Not on file   Intimate partner violence    Fear of current or ex partner: Not on file    Emotionally abused: Not on file    Physically abused: Not on file    Forced sexual activity: Not on file  Other Topics Concern   Not on file  Social History Narrative   Not on file     Physical Exam  Vital Signs and Nursing Notes reviewed Vitals:   10/13/19 0218  BP: (!) 142/90  Pulse: 78  Resp: 16  Temp: 98.1 F (36.7 C)  SpO2: 98%    CONSTITUTIONAL: Well-appearing, NAD NEURO:  Alert and oriented x 3, no focal deficits EYES:  eyes equal and reactive ENT/NECK:  no LAD, no JVD CARDIO: Regular rate, well-perfused, normal S1 and S2 PULM:  CTAB no wheezing or rhonchi GI/GU:  normal bowel sounds, non-distended, non-tender MSK/SPINE:  No gross deformities, no edema SKIN: Erythematous nodule with white center to the right antecubital fossa PSYCH:  Appropriate speech and behavior  Diagnostic and Interventional Summary    EKG Interpretation  Date/Time:    Ventricular Rate:    PR Interval:    QRS Duration:  QT Interval:    QTC Calculation:   R Axis:     Text Interpretation:        Labs Reviewed - No data to display  No orders to display    Medications  pentafluoroprop-tetrafluoroeth (GEBAUERS) aerosol (has no administration in time range)     Procedures  /  Critical Care .Marland KitchenIncision and Drainage  Date/Time: 10/13/2019 2:55 AM Performed by: Maudie Flakes, MD Authorized by: Maudie Flakes, MD   Consent:    Consent obtained:  Verbal   Consent given by:  Patient   Risks discussed:  Bleeding, incomplete drainage and infection Location:    Type:  Abscess   Size:  1cm   Location:  Upper extremity   Upper extremity location: Right antecubital fossa. Pre-procedure details:    Procedure prep: Alcohol swab. Anesthesia (see MAR for exact dosages):    Anesthesia method:  Topical application   Topical anesthesia: Freeze spray. Procedure  type:    Complexity:  Simple Procedure details:    Incision types:  Stab incision   Incision depth:  Dermal   Scalpel blade:  15   Drainage:  Bloody   Drainage amount:  Scant   Wound treatment:  Wound left open   Packing materials:  None Post-procedure details:    Patient tolerance of procedure:  Tolerated well, no immediate complications    ED Course and Medical Decision Making  I have reviewed the triage vital signs and the nursing notes.  Pertinent labs & imaging results that were available during my care of the patient were reviewed by me and considered in my medical decision making (see below for details).     Question of abscess related to IV drug use given the location of the abscess palpation denies this.  No fever, no evidence of systemic illness.  Drained as described above, appropriate for discharge.    Barth Kirks. Sedonia Small, MD [place] [email or web address]  Final Clinical Impressions(s) / ED Diagnoses     ICD-10-CM   1. Folliculitis  M19.6     ED Discharge Orders         Ordered    sulfamethoxazole-trimethoprim (BACTRIM DS) 800-160 MG tablet  2 times daily     10/13/19 0255           Discharge Instructions Discussed with and Provided to Patient:     Discharge Instructions     You were evaluated in the Emergency Department and after careful evaluation, we did not find any emergent condition requiring admission or further testing in the hospital.  Your exam/testing today is overall reassuring.  We drained your abscess here in the emergency department.  Please take the antibiotics provided as directed.  Please return to the Emergency Department if you experience any worsening of your condition.  We encourage you to follow up with a primary care provider.  Thank you for allowing [place] to be a part of your care.       Maudie Flakes, MD 10/13/19 [phone]
